# Patient Record
Sex: Female | Born: 1995 | Race: White | Hispanic: No | Marital: Single | Smoking: Never smoker
Health system: Southern US, Community
[De-identification: ages and names within clinical notes are randomized; demographics above are authoritative.]

---
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
## 2017-10-27 ENCOUNTER — Other Ambulatory Visit: Payer: Self-pay

## 2017-10-27 MED ORDER — FLUOXETINE HCL 40 MG PO CAPS: 40.0000 mg | ORAL_CAPSULE | Freq: Every day | ORAL | 1 refills | Status: DC

## 2018-01-01 ENCOUNTER — Ambulatory Visit: Payer: Self-pay | Admitting: Psychiatry

## 2018-02-09 ENCOUNTER — Encounter: Payer: Self-pay | Admitting: Emergency Medicine

## 2018-02-09 DIAGNOSIS — F329 Major depressive disorder, single episode, unspecified: Secondary | ICD-10-CM | POA: Insufficient documentation

## 2018-02-09 DIAGNOSIS — F32A Depression, unspecified: Secondary | ICD-10-CM | POA: Insufficient documentation

## 2018-02-09 DIAGNOSIS — F419 Anxiety disorder, unspecified: Secondary | ICD-10-CM

## 2018-03-28 ENCOUNTER — Ambulatory Visit: Payer: 59 | Admitting: Psychiatry

## 2018-03-28 DIAGNOSIS — F419 Anxiety disorder, unspecified: Secondary | ICD-10-CM | POA: Diagnosis not present

## 2018-03-28 MED ORDER — FLUOXETINE HCL 40 MG PO CAPS: 40.0000 mg | ORAL_CAPSULE | Freq: Every day | ORAL | 1 refills | Status: DC

## 2018-03-28 NOTE — Progress Notes (Signed)
[facility]  Patient ID: Sydney Peters,  MRN: [id number]  PCP: Patient, No Pcp Per  Date of Evaluation: 03/28/2018 Time spent:20 minutes  Chief Complaint:   HISTORY/CURRENT STATUS: HPI patient last seen 10/10/2017.  She has occasional day of depression we decided not to treat it but just to follow it.  She is to continue on her Prozac at 40 mg a day. Today she gives a history of nausea going on for 2 to 3 days at a time and the most recent one last week.  She is only had that episode several times is being worked up by her family physician.  Individual Medical History/ Review of Systems: Changes? :No   Allergies: Patient has no allergy information on record.  Current Medications:  Current Outpatient Medications:  .  FLUoxetine (PROZAC) 40 MG capsule, Take 1 capsule (40 mg total) by mouth daily., Disp: 90 capsule, Rfl: 1 Medication Side Effects: none  Family Medical/ Social History: Changes? no  MENTAL HEALTH EXAM:  There were no vitals taken for this visit.There is no height or weight on file to calculate BMI.  General Appearance: Casual  Eye Contact:  Good  Speech:  Clear and Coherent  Volume:  Normal  Mood:  Euthymic  Affect:  Appropriate  Thought Process:  Linear  Orientation:  Full (Time, Place, and Person)  Thought Content: WDL   Suicidal Thoughts:  No  Homicidal Thoughts:  No  Memory:  WNL  Judgement:  Good  Insight:  Good  Psychomotor Activity:  Normal  Concentration:  Concentration: Good  Recall:  Good  Fund of Knowledge: Good  Language: Good  Assets:  Desire for Improvement  ADL's:  Intact  Cognition: WNL  Prognosis:  Good    DIAGNOSES:    ICD-10-CM   1. Anxiety F41.9     Receiving Psychotherapy: No    RECOMMENDATIONS: We will continue patient on Prozac 40 mg a day.  She is to keep me posted if they find anything more about her episodic nausea. Return in 4 months.   Anne Fu, PA-C

## 2018-07-25 ENCOUNTER — Ambulatory Visit: Payer: 59 | Admitting: Psychiatry

## 2018-08-29 ENCOUNTER — Ambulatory Visit (INDEPENDENT_AMBULATORY_CARE_PROVIDER_SITE_OTHER): Payer: 59 | Admitting: Adult Health

## 2018-08-29 ENCOUNTER — Other Ambulatory Visit: Payer: Self-pay

## 2018-08-29 DIAGNOSIS — F411 Generalized anxiety disorder: Secondary | ICD-10-CM

## 2018-08-29 DIAGNOSIS — F331 Major depressive disorder, recurrent, moderate: Secondary | ICD-10-CM

## 2018-09-15 ENCOUNTER — Encounter: Payer: Self-pay | Admitting: Adult Health

## 2018-09-15 NOTE — Progress Notes (Signed)
Sydney Peters [id number] 08/28/1995 23 y.o.  Subjective:   Patient ID:  Sydney Peters is a 23 y.o. (DOB 02/25/1995) female.  Chief Complaint:  Chief Complaint  Patient presents with  . Anxiety    HPI Sydney Peters presents to the office today for follow-up of anxiety.  Describes mood today as "ok". Pleasant. Mood symptoms - denies depression, anxiety, and irritability. Increased anxiety at times. Stating "I'm doing alright". Stable interest and motivation. Taking medications as prescribed.  Energy levels stable. Active, does not have a regular exercise routine. Enjoys some usual interests and activities. Spending time with family. Appetite adequate. Weight stable. Sleeps well most nights. Averages 6 to 8 hours. Focus and concentration stable. Completing tasks. Managing aspects of household.  Denies SI or HI.  Denies AH or VH.  Review of Systems:  Review of Systems  Musculoskeletal: Negative for gait problem.  Neurological: Negative for tremors.  Psychiatric/Behavioral:       Please refer to HPI    Medications: I have reviewed the patient's current medications.  Current Outpatient Medications  Medication Sig Dispense Refill  . FLUoxetine (PROZAC) 40 MG capsule Take 1 capsule (40 mg total) by mouth daily. 90 capsule 1   No current facility-administered medications for this visit.     Medication Side Effects: None  Allergies: Not on File  No past medical history on file.  No family history on file.  Social History   Socioeconomic History  . Marital status: Single    Spouse name: Not on file  . Number of children: Not on file  . Years of education: Not on file  . Highest education level: Not on file  Occupational History  . Not on file  Social Needs  . Financial resource strain: Not on file  . Food insecurity    Worry: Not on file    Inability: Not on file  . Transportation needs    Medical: Not on file    Non-medical: Not on file  Tobacco Use  .  Smoking status: Not on file  Substance and Sexual Activity  . Alcohol use: Not on file  . Drug use: Not on file  . Sexual activity: Not on file  Lifestyle  . Physical activity    Days per week: Not on file    Minutes per session: Not on file  . Stress: Not on file  Relationships  . Social Musicianconnections    Talks on phone: Not on file    Gets together: Not on file    Attends religious service: Not on file    Active member of club or organization: Not on file    Attends meetings of clubs or organizations: Not on file    Relationship status: Not on file  . Intimate partner violence    Fear of current or ex partner: Not on file    Emotionally abused: Not on file    Physically abused: Not on file    Forced sexual activity: Not on file  Other Topics Concern  . Not on file  Social History Narrative  . Not on file    Past Medical History, Surgical history, Social history, and Family history were reviewed and updated as appropriate.   Please see review of systems for further details on the patient's review from today.   Objective:   Physical Exam:  There were no vitals taken for this visit.  Physical Exam Constitutional:      General: She is not in acute distress.  Appearance: She is well-developed.  Musculoskeletal:        General: No deformity.  Neurological:     Mental Status: She is alert and oriented to person, place, and time.     Coordination: Coordination normal.  Psychiatric:        Attention and Perception: Attention and perception normal. She does not perceive auditory or visual hallucinations.        Mood and Affect: Mood normal. Mood is not anxious or depressed. Affect is not labile, blunt, angry or inappropriate.        Speech: Speech normal.        Behavior: Behavior normal.        Thought Content: Thought content normal. Thought content is not paranoid or delusional. Thought content does not include homicidal or suicidal ideation. Thought content does not  include homicidal or suicidal plan.        Cognition and Memory: Cognition and memory normal.        Judgment: Judgment normal.     Comments: Insight intact     Lab Review:  No results found for: NA, K, CL, CO2, GLUCOSE, BUN, CREATININE, CALCIUM, PROT, ALBUMIN, AST, ALT, ALKPHOS, BILITOT, GFRNONAA, GFRAA  No results found for: WBC, RBC, HGB, HCT, PLT, MCV, MCH, MCHC, RDW, LYMPHSABS, MONOABS, EOSABS, BASOSABS  No results found for: POCLITH, LITHIUM   No results found for: PHENYTOIN, PHENOBARB, VALPROATE, CBMZ   .res Assessment: Plan:    Plan:  1. Continue Prozac 40mg  daily  RTC 4 weeks  Patient advised to contact office with any questions, adverse effects, or acute worsening in signs and symptoms.  There are no diagnoses linked to this encounter.   Please see After Visit Summary for patient specific instructions.  Future Appointments  Date Time Provider [place]  02/28/2019  4:00 PM Anish Vana, Berdie Ogren, NP [facility] None    No orders of the defined types were placed in this encounter.   -------------------------------

## 2019-02-28 ENCOUNTER — Ambulatory Visit: Payer: 59 | Admitting: Adult Health

## 2021-03-12 ENCOUNTER — Telehealth: Payer: Self-pay | Admitting: Adult Health

## 2021-03-12 NOTE — Telephone Encounter (Signed)
Pt called and wanted a refill of Prozac until they get to their appt on 3/1.  Pharmacy is [facility] on [address].

## 2021-03-12 NOTE — Telephone Encounter (Signed)
Since it has been so long since patient has been seen I let her know that we could not prescribe anything until she sees [place].

## 2021-03-24 ENCOUNTER — Other Ambulatory Visit: Payer: Self-pay

## 2021-03-24 ENCOUNTER — Encounter: Payer: Self-pay | Admitting: Adult Health

## 2021-03-24 ENCOUNTER — Ambulatory Visit (INDEPENDENT_AMBULATORY_CARE_PROVIDER_SITE_OTHER): Payer: 59 | Admitting: Adult Health

## 2021-03-24 DIAGNOSIS — F411 Generalized anxiety disorder: Secondary | ICD-10-CM | POA: Diagnosis not present

## 2021-03-24 NOTE — Progress Notes (Signed)
Leonor Liv ?[id number] ?Dec 18, 1995 ?26 y.o. ? ?Subjective:  ? ?Patient ID:  Tonnette Zwiebel is a 26 y.o. (DOB 04-26-1995) female. ? ?Chief Complaint: No chief complaint on file. ? ? ?HPI ?Tasheena Wambolt presents to the office today for follow-up of anxiety. ? ?Describes mood today as "ok". Pleasant. Mood symptoms - denies depression. Feels irritable at times. Feels more anxious overall. Stating "I feel like I'm doing ok now". Her mother made her the appointment today after she made a statement about "killing herself". Stating "I have felt so overwhelmed trying to please everyone". Started dating in July - met through a mutual friend - her first serious boyfriend. Felt like things moved to quickly and he wanted the relationship to advance more quickly than she was ready for. Felt like he questioned her about things he shouldn't - couldn't look at people - questioning her past. Friends and family did not feel like she was in a healthy relationship. She felt torn throughout the relationship and has ended it. He is still trying to communicate with her. Reports some passive suicidal ideations over the past month - caught between her boyfriend and friends - told her mother she had told her boyfriend she wanted to kill herself after feeling so pressured. Has felt more stable over the past week. Stating "I'm not having those thoughts anymore". Stopped taking Prozac 2 years ago and does not want to restart medications. ?Stating "I don't feel like I need medication". Would like to see a therapist to  Feels like she would like to talk process through her relationship. Stable interest and motivation.  ?Energy levels stable. Active, has a regular exercise routine - going to the gym daily. ?Enjoys some usual interests and activities. Single lives alone. Parents local. Spending time with family. ?Appetite adequate. Weight loss - intentional - eating healthy. ?Sleeps well most nights. Averages 6 to 8 hours. Using Melatonin for the  past month - 5 to 38m. ?Focus and concentration stable. Completing tasks. Managing aspects of household. 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   ?Denies AH or VH. ? ?Review of Systems:  ?Review of Systems  ?Musculoskeletal:  Negative for gait problem.  ?Neurological:  Negative for tremors.  ?Psychiatric/Behavioral:    ?     Please refer to HPI  ? ?Medications: I have reviewed the patient's current medications. ? ?No current outpatient medications on file.  ? ?No current facility-administered medications for this visit.  ? ? ?Medication Side Effects: None ? ?Allergies: No Known Allergies ? ?No past medical history on file. ? ?Past Medical History, Surgical history, Social history, and Family history were reviewed and updated as appropriate.  ? ?Please see review of systems for further details on the patient's review from today.  ? ?Objective:  ? ?Physical Exam:  ?There were no vitals taken for this visit. ? ?Physical Exam ?Constitutional:   ?   General: She is not in acute distress. ?Musculoskeletal:     ?   General: No deformity.  ?Neurological:  ?   Mental Status: She is alert and oriented to person, place, and time.  ?   Coordination: Coordination normal.  ?Psychiatric:     ?   Attention and Perception: Attention and perception normal. She does not perceive auditory or visual hallucinations.     ?   Mood and Affect: Mood normal. Mood is not anxious or depressed. Affect is not labile, blunt, angry or inappropriate.     ?  Speech: Speech normal.     ?   Behavior: Behavior normal.     ?   Thought Content: Thought content normal. Thought content is not paranoid or delusional. Thought content does not include homicidal or suicidal ideation. Thought content does not include homicidal or suicidal plan.     ?   Cognition and Memory: Cognition and memory normal.     ?   Judgment: Judgment normal.  ?   Comments: Insight intact  ? ? ?Lab Review:  ?No results found for: NA, K, CL, CO2, GLUCOSE, BUN,  CREATININE, CALCIUM, PROT, ALBUMIN, AST, ALT, ALKPHOS, BILITOT, GFRNONAA, GFRAA ? ?No results found for: WBC, RBC, HGB, HCT, PLT, MCV, MCH, MCHC, RDW, LYMPHSABS, MONOABS, EOSABS, BASOSABS ? ?No results found for: POCLITH, LITHIUM  ? ?No results found for: PHENYTOIN, PHENOBARB, VALPROATE, CBMZ  ? ?.res ?Assessment: Plan:   ? ?Plan: ? ?Will not start medications at this time. ? ?Referred to counseling - cards given for Eliot Ford or Tamora Crutchfield's office. ? ?Advised to add mother to [facility] if wanted her to receive information. ? ?RTC as needed. ? ?Patient advised to contact office with any questions, adverse effects, or acute worsening in signs and symptoms. ? ? ?Time spent with patient was 25 minutes. Greater than 50% of face to face time with patient was spent on counseling and coordination of care.   ? ?Diagnoses and all orders for this visit: ? ?Generalized anxiety disorder ? ?  ? ?Please see After Visit Summary for patient specific instructions. ? ?No future appointments. ? ? ?No orders of the defined types were placed in this encounter. ? ? ?------------------------------- ?

## 2021-06-15 ENCOUNTER — Telehealth: Payer: Self-pay | Admitting: Adult Health

## 2021-06-15 NOTE — Telephone Encounter (Signed)
Pt called at 3:52 pm asking to go back on  the prozac. Pharmacy is [facility] [address]

## 2021-06-15 NOTE — Telephone Encounter (Signed)
Please call patient to schedule an appt, per Gina's note.

## 2021-06-15 NOTE — Telephone Encounter (Signed)
Needs an appt to discuss.

## 2021-06-16 NOTE — Telephone Encounter (Signed)
LVM for pt to call and schedule  °

## 2021-08-16 ENCOUNTER — Ambulatory Visit (INDEPENDENT_AMBULATORY_CARE_PROVIDER_SITE_OTHER): Payer: BC Managed Care – PPO | Admitting: Adult Health

## 2021-08-16 ENCOUNTER — Encounter: Payer: Self-pay | Admitting: Adult Health

## 2021-08-16 DIAGNOSIS — F320 Major depressive disorder, single episode, mild: Secondary | ICD-10-CM | POA: Diagnosis not present

## 2021-08-16 DIAGNOSIS — F422 Mixed obsessional thoughts and acts: Secondary | ICD-10-CM

## 2021-08-16 DIAGNOSIS — F411 Generalized anxiety disorder: Secondary | ICD-10-CM

## 2021-08-16 MED ORDER — FLUOXETINE HCL 20 MG PO CAPS: 20.0000 mg | ORAL_CAPSULE | Freq: Every day | ORAL | 2 refills | Status: DC

## 2021-08-16 NOTE — Progress Notes (Signed)
Sharlynn Seckinger [id number] [birth year] 26 y.o.  Subjective:   Patient ID:  Sydney Peters is a 26 y.o. (DOB October 08, 1995) female.  Chief Complaint: No chief complaint on file.   HPI Sydney Peters presents to the office today for follow-up of MDD and GAD.   Describes mood today as "ok". Tearful at times. Pleasant. Mood symptoms - reports depression, anxiety, and irritability. Reports worry and rumination. Reports obsessional thoughts and acts. Reports mood fluctuations. Has tried to manage mood symptoms with therapy and on her on, but feels like she may medication. She has taken Prozac in the past and is willing to try it again. Stating "I'm not doing too good managing things on my own". Has seen an online therapist over the past 6 months - [employer]. Stable interest and motivation.  Energy levels stable. Active, has a regular exercise routine - going to the gym daily. Enjoys some usual interests and activities. Single lives alone. Parents local. Spending time with family. Appetite adequate. Weight stable. Sleeps well most nights. Averages 6 to 8 hours. Has used Melatonin up until a few days ago. Focus and concentration stable. Completing tasks. Managing aspects of household. 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 Denies SI or HI.   Denies AH or VH.     Review of Systems:  Review of Systems  Musculoskeletal:  Negative for gait problem.  Neurological:  Negative for tremors.  Psychiatric/Behavioral:         Please refer to HPI    Medications: I have reviewed the patient's current medications.  No current outpatient medications on file.   No current facility-administered medications for this visit.    Medication Side Effects: None  Allergies: No Known Allergies  No past medical history on file.  Past Medical History, Surgical history, Social history, and Family history were reviewed and updated as appropriate.   Please see review of systems for  further details on the patient's review from today.   Objective:   Physical Exam:  There were no vitals taken for this visit.  Physical Exam Constitutional:      General: She is not in acute distress. Musculoskeletal:        General: No deformity.  Neurological:     Mental Status: She is alert and oriented to person, place, and time.     Coordination: Coordination normal.  Psychiatric:        Attention and Perception: Attention and perception normal. She does not perceive auditory or visual hallucinations.        Mood and Affect: Mood normal. Mood is not anxious or depressed. Affect is not labile, blunt, angry or inappropriate.        Speech: Speech normal.        Behavior: Behavior normal.        Thought Content: Thought content normal. Thought content is not paranoid or delusional. Thought content does not include homicidal or suicidal ideation. Thought content does not include homicidal or suicidal plan.        Cognition and Memory: Cognition and memory normal.        Judgment: Judgment normal.     Comments: Insight intact     Lab Review:  No results found for: "NA", "K", "CL", "CO2", "GLUCOSE", "BUN", "CREATININE", "CALCIUM", "PROT", "ALBUMIN", "AST", "ALT", "ALKPHOS", "BILITOT", "GFRNONAA", "GFRAA"  No results found for: "WBC", "RBC", "HGB", "HCT", "PLT", "MCV", "MCH", "MCHC", "RDW", "LYMPHSABS", "MONOABS", "EOSABS", "BASOSABS"  No results found for: "POCLITH", "LITHIUM"  No results found for: "PHENYTOIN", "PHENOBARB", "VALPROATE", "CBMZ"   .res Assessment: Plan:    Plan:  Add Prozac 20mg  daily - has taken previously  Working with a therapist.   Advised to add mother to [facility]   RTC 4 weeks  Patient advised to contact office with any questions, adverse effects, or acute worsening in signs and symptoms.  Time spent with patient was 25 minutes. Greater than 50% of face to face time with patient was spent on counseling and coordination of care.    There are no  diagnoses linked to this encounter.   Please see After Visit Summary for patient specific instructions.  No future appointments.  No orders of the defined types were placed in this encounter.   -------------------------------

## 2021-09-13 ENCOUNTER — Encounter: Payer: Self-pay | Admitting: Adult Health

## 2021-09-13 ENCOUNTER — Ambulatory Visit (INDEPENDENT_AMBULATORY_CARE_PROVIDER_SITE_OTHER): Payer: BC Managed Care – PPO | Admitting: Adult Health

## 2021-09-13 DIAGNOSIS — F320 Major depressive disorder, single episode, mild: Secondary | ICD-10-CM

## 2021-09-13 DIAGNOSIS — F422 Mixed obsessional thoughts and acts: Secondary | ICD-10-CM | POA: Diagnosis not present

## 2021-09-13 DIAGNOSIS — F411 Generalized anxiety disorder: Secondary | ICD-10-CM | POA: Diagnosis not present

## 2021-09-13 NOTE — Progress Notes (Signed)
Sydney Peters [id number] 11-15-1995 26 y.o.  Subjective:   Patient ID:  Sydney Peters is a 26 y.o. (DOB 1995/05/03) female.  Chief Complaint: No chief complaint on file.   HPI Sydney Peters presents to the office today for follow-up of MDD and GAD.   Describes mood today as "ok". Tearful at times. Pleasant. Mood symptoms - reports decreased depression, anxiety and irritability. Reports decreased worry and rumination. Reports decreased obsessional thoughts and acts. Reports decreased mood fluctuations. Stating "I feel better". Feels like the Prozac has been helpful. Planning to continue with therapist. Stable interest and motivation.  Energy levels stable. Active, has a regular exercise routine - going to the gym daily. Enjoys some usual interests and activities. Single lives alone. Parents local. Spending time with family. Appetite adequate. Weight stable. Sleeps well most nights. Averages 6 to 8 hours - using Melatonin as needed. Focus and concentration stable. Completing tasks. Managing aspects of household. 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 Denies SI or HI.   Denies AH or VH.  Review of Systems:  Review of Systems  Musculoskeletal:  Negative for gait problem.  Neurological:  Negative for tremors.  Psychiatric/Behavioral:         Please refer to HPI    Medications: I have reviewed the patient's current medications.  Current Outpatient Medications  Medication Sig Dispense Refill   FLUoxetine (PROZAC) 20 MG capsule Take 1 capsule (20 mg total) by mouth daily. 30 capsule 2   No current facility-administered medications for this visit.    Medication Side Effects: None  Allergies: No Known Allergies  No past medical history on file.  Past Medical History, Surgical history, Social history, and Family history were reviewed and updated as appropriate.   Please see review of systems for further details on the patient's review from today.    Objective:   Physical Exam:  There were no vitals taken for this visit.  Physical Exam Constitutional:      General: She is not in acute distress. Musculoskeletal:        General: No deformity.  Neurological:     Mental Status: She is alert and oriented to person, place, and time.     Coordination: Coordination normal.  Psychiatric:        Attention and Perception: Attention and perception normal. She does not perceive auditory or visual hallucinations.        Mood and Affect: Mood normal. Mood is not anxious or depressed. Affect is not labile, blunt, angry or inappropriate.        Speech: Speech normal.        Behavior: Behavior normal.        Thought Content: Thought content normal. Thought content is not paranoid or delusional. Thought content does not include homicidal or suicidal ideation. Thought content does not include homicidal or suicidal plan.        Cognition and Memory: Cognition and memory normal.        Judgment: Judgment normal.     Comments: Insight intact     Lab Review:  No results found for: "NA", "K", "CL", "CO2", "GLUCOSE", "BUN", "CREATININE", "CALCIUM", "PROT", "ALBUMIN", "AST", "ALT", "ALKPHOS", "BILITOT", "GFRNONAA", "GFRAA"  No results found for: "WBC", "RBC", "HGB", "HCT", "PLT", "MCV", "MCH", "MCHC", "RDW", "LYMPHSABS", "MONOABS", "EOSABS", "BASOSABS"  No results found for: "POCLITH", "LITHIUM"   No results found for: "PHENYTOIN", "PHENOBARB", "VALPROATE", "CBMZ"   .res Assessment: Plan:    Plan:  Prozac 20mg  daily  RTC 2 months  Patient advised to contact office with any questions, adverse effects, or acute worsening in signs and symptoms.  Time spent with patient was 15 minutes. Greater than 50% of face to face time with patient was spent on counseling and coordination of care.    Diagnoses and all orders for this visit:  Generalized anxiety disorder  Current mild episode of major depressive disorder, unspecified whether recurrent  (HCC)  Mixed obsessional thoughts and acts     Please see After Visit Summary for patient specific instructions.  No future appointments.   No orders of the defined types were placed in this encounter.   -------------------------------

## 2021-11-16 ENCOUNTER — Encounter: Payer: Self-pay | Admitting: Adult Health

## 2021-11-16 ENCOUNTER — Telehealth (INDEPENDENT_AMBULATORY_CARE_PROVIDER_SITE_OTHER): Payer: BC Managed Care – PPO | Admitting: Adult Health

## 2021-11-16 DIAGNOSIS — F411 Generalized anxiety disorder: Secondary | ICD-10-CM

## 2021-11-16 DIAGNOSIS — F320 Major depressive disorder, single episode, mild: Secondary | ICD-10-CM | POA: Diagnosis not present

## 2021-11-16 MED ORDER — FLUOXETINE HCL 20 MG PO CAPS: 20.0000 mg | ORAL_CAPSULE | Freq: Every day | ORAL | 1 refills | Status: DC

## 2021-11-16 NOTE — Progress Notes (Signed)
Sydney Peters [id number] 28-Mar-1995 26 y.o.  Virtual Visit via Video Note  I connected with pt @ on 11/16/21 at  5:00 PM EDT by a video enabled telemedicine application and verified that I am speaking with the correct person using two identifiers.   I discussed the limitations of evaluation and management by telemedicine and the availability of in person appointments. The patient expressed understanding and agreed to proceed.  I discussed the assessment and treatment plan with the patient. The patient was provided an opportunity to ask questions and all were answered. The patient agreed with the plan and demonstrated an understanding of the instructions.   The patient was advised to call back or seek an in-person evaluation if the symptoms worsen or if the condition fails to improve as anticipated.  I provided 10 minutes of non-face-to-face time during this encounter.  The patient was located at home.  The provider was located at [facility].   Dorothyann Gibbs, 26   Subjective:   Patient ID:  Sydney Peters is a 26 y.o. (DOB [birth year]) female.  Chief Complaint: No chief complaint on file.   HPI Sydney Peters presents for follow-up of MDD and GAD.  Describes mood today as "ok". Tearful at times. Pleasant. Mood symptoms - reports decreased depression, anxiety and irritability. Reports decreased worry and rumination. Reports decreased obsessional thoughts and acts. Reports decreased mood fluctuations. Stating "I feel better". Feels like the Prozac has been helpful. Planning to continue with therapist. Stable interest and motivation.  Energy levels stable. Active, has a regular exercise routine - going to the gym daily. Enjoys some usual interests and activities. Single lives alone. Parents local. Spending time with family. Appetite adequate. Weight stable. Sleeps well most nights. Averages 6 to 8 hours - using Melatonin as needed. Focus and concentration stable.  Completing tasks. Managing aspects of household. 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 Denies SI or HI.   Denies AH or VH.   Review of Systems:  Review of Systems  Musculoskeletal:  Negative for gait problem.  Neurological:  Negative for tremors.  Psychiatric/Behavioral:         Please refer to HPI    Medications: I have reviewed the patient's current medications.  Current Outpatient Medications  Medication Sig Dispense Refill   FLUoxetine (PROZAC) 20 MG capsule Take 1 capsule (20 mg total) by mouth daily. 90 capsule 1   No current facility-administered medications for this visit.    Medication Side Effects: None  Allergies: No Known Allergies  No past medical history on file.  No family history on file.  Social History   Socioeconomic History   Marital status: Single    Spouse name: Not on file   Number of children: Not on file   Years of education: Not on file   Highest education level: Not on file  Occupational History   Not on file  Tobacco Use   Smoking status: Never   Smokeless tobacco: Never  Substance and Sexual Activity   Alcohol use: Not on file   Drug use: Not on file   Sexual activity: Not on file  Other Topics Concern   Not on file  Social History Narrative   Not on file   Social Determinants of Health   Financial Resource Strain: Not on file  Food Insecurity: Not on file  Transportation Needs: Not on file  Physical Activity: Not on file  Stress: Not on file  Social Connections: Not  on file  Intimate Partner Violence: Not on file    Past Medical History, Surgical history, Social history, and Family history were reviewed and updated as appropriate.   Please see review of systems for further details on the patient's review from today.   Objective:   Physical Exam:  There were no vitals taken for this visit.  Physical Exam Constitutional:      General: She is not in acute distress. Musculoskeletal:         General: No deformity.  Neurological:     Mental Status: She is alert and oriented to person, place, and time.     Coordination: Coordination normal.  Psychiatric:        Attention and Perception: Attention and perception normal. She does not perceive auditory or visual hallucinations.        Mood and Affect: Mood normal. Mood is not anxious or depressed. Affect is not labile, blunt, angry or inappropriate.        Speech: Speech normal.        Behavior: Behavior normal.        Thought Content: Thought content normal. Thought content is not paranoid or delusional. Thought content does not include homicidal or suicidal ideation. Thought content does not include homicidal or suicidal plan.        Cognition and Memory: Cognition and memory normal.        Judgment: Judgment normal.     Comments: Insight intact     Lab Review:  No results found for: "NA", "K", "CL", "CO2", "GLUCOSE", "BUN", "CREATININE", "CALCIUM", "PROT", "ALBUMIN", "AST", "ALT", "ALKPHOS", "BILITOT", "GFRNONAA", "GFRAA"  No results found for: "WBC", "RBC", "HGB", "HCT", "PLT", "MCV", "MCH", "MCHC", "RDW", "LYMPHSABS", "MONOABS", "EOSABS", "BASOSABS"  No results found for: "POCLITH", "LITHIUM"   No results found for: "PHENYTOIN", "PHENOBARB", "VALPROATE", "CBMZ"   .res Assessment: Plan:    Plan:  Prozac 20mg  daily  RTC 3 months  Patient advised to contact office with any questions, adverse effects, or acute worsening in signs and symptoms.  Time spent with patient was 15 minutes. Greater than 50% of face to face time with patient was spent on counseling and coordination of care.    Diagnoses and all orders for this visit:  Generalized anxiety disorder -     FLUoxetine (PROZAC) 20 MG capsule; Take 1 capsule (20 mg total) by mouth daily.  Current mild episode of major depressive disorder, unspecified whether recurrent (HCC) -     FLUoxetine (PROZAC) 20 MG capsule; Take 1 capsule (20 mg total) by mouth daily.      Please see After Visit Summary for patient specific instructions.  No future appointments.  No orders of the defined types were placed in this encounter.     -------------------------------

## 2021-11-16 NOTE — Progress Notes (Signed)
Sydney Peters [id number] 11-Mar-1995 26 y.o.  Virtual Visit via Video Note  I connected with pt @ on 11/16/21 at  5:00 PM EDT by a video enabled telemedicine application and verified that I am speaking with the correct person using two identifiers.   I discussed the limitations of evaluation and management by telemedicine and the availability of in person appointments. The patient expressed understanding and agreed to proceed.  I discussed the assessment and treatment plan with the patient. The patient was provided an opportunity to ask questions and all were answered. The patient agreed with the plan and demonstrated an understanding of the instructions.   The patient was advised to call back or seek an in-person evaluation if the symptoms worsen or if the condition fails to improve as anticipated.  I provided 10 minutes of non-face-to-face time during this encounter.  The patient was located at home.  The provider was located at [facility].   Aloha Gell, NP   Subjective:   Patient ID:  Sydney Peters is a 26 y.o. (DOB [birth year]) female.  Chief Complaint: No chief complaint on file.   HPI Sydney Peters presents for follow-up of MDD and GAD.   Describes mood today as "ok". Tearful at times. Pleasant. Mood symptoms - reports decreased depression, anxiety and irritability - "feeling more stable". Reports decreased worry and rumination - "less frequently". Reports decreased obsessional thoughts and acts. Mood is more consistent. Stating "I feel like I'm doing ok".Feels like the Prozac works well. Planning to continue with therapist. Stable interest and motivation.  Energy levels stable. Active, has a regular exercise routine - going to the gym daily. Enjoys some usual interests and activities. Single lives alone. Parents local. Spending time with family. Appetite adequate. Weight stable. Sleeps well most nights. Averages 6 to 8 hours. Focus and concentration stable.  Completing tasks. Managing aspects of household. 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 Denies SI or HI.   Denies AH or VH.   Review of Systems:  Review of Systems  Musculoskeletal:  Negative for gait problem.  Neurological:  Negative for tremors.  Psychiatric/Behavioral:         Please refer to HPI    Medications: I have reviewed the patient's current medications.  Current Outpatient Medications  Medication Sig Dispense Refill   FLUoxetine (PROZAC) 20 MG capsule Take 1 capsule (20 mg total) by mouth daily. 30 capsule 2   No current facility-administered medications for this visit.    Medication Side Effects: None  Allergies: No Known Allergies  No past medical history on file.  No family history on file.  Social History   Socioeconomic History   Marital status: Single    Spouse name: Not on file   Number of children: Not on file   Years of education: Not on file   Highest education level: Not on file  Occupational History   Not on file  Tobacco Use   Smoking status: Never   Smokeless tobacco: Never  Substance and Sexual Activity   Alcohol use: Not on file   Drug use: Not on file   Sexual activity: Not on file  Other Topics Concern   Not on file  Social History Narrative   Not on file   Social Determinants of Health   Financial Resource Strain: Not on file  Food Insecurity: Not on file  Transportation Needs: Not on file  Physical Activity: Not on file  Stress: Not on file  Social Connections: Not on file  Intimate Partner Violence: Not on file    Past Medical History, Surgical history, Social history, and Family history were reviewed and updated as appropriate.   Please see review of systems for further details on the patient's review from today.   Objective:   Physical Exam:  There were no vitals taken for this visit.  Physical Exam Constitutional:      General: She is not in acute distress. Musculoskeletal:         General: No deformity.  Neurological:     Mental Status: She is alert and oriented to person, place, and time.     Coordination: Coordination normal.  Psychiatric:        Attention and Perception: Attention and perception normal. She does not perceive auditory or visual hallucinations.        Mood and Affect: Mood normal. Mood is not anxious or depressed. Affect is not labile, blunt, angry or inappropriate.        Speech: Speech normal.        Behavior: Behavior normal.        Thought Content: Thought content normal. Thought content is not paranoid or delusional. Thought content does not include homicidal or suicidal ideation. Thought content does not include homicidal or suicidal plan.        Cognition and Memory: Cognition and memory normal.        Judgment: Judgment normal.     Comments: Insight intact     Lab Review:  No results found for: "NA", "K", "CL", "CO2", "GLUCOSE", "BUN", "CREATININE", "CALCIUM", "PROT", "ALBUMIN", "AST", "ALT", "ALKPHOS", "BILITOT", "GFRNONAA", "GFRAA"  No results found for: "WBC", "RBC", "HGB", "HCT", "PLT", "MCV", "MCH", "MCHC", "RDW", "LYMPHSABS", "MONOABS", "EOSABS", "BASOSABS"  No results found for: "POCLITH", "LITHIUM"   No results found for: "PHENYTOIN", "PHENOBARB", "VALPROATE", "CBMZ"   .res Assessment: Plan:    Plan:  Prozac 20mg  daily  RTC 2 months  Patient advised to contact office with any questions, adverse effects, or acute worsening in signs and symptoms.  Time spent with patient was 15 minutes. Greater than 50% of face to face time with patient was spent on counseling and coordination of care.    Diagnoses and all orders for this visit:  Generalized anxiety disorder  Current mild episode of major depressive disorder, unspecified whether recurrent (HCC)     Please see After Visit Summary for patient specific instructions.  Future Appointments  Date Time Provider Department Center  11/16/2021  5:00 PM Jaleil Renwick, 11/18/2021, NP [facility] None    No orders of the defined types were placed in this encounter.     -------------------------------

## 2021-11-29 ENCOUNTER — Ambulatory Visit (INDEPENDENT_AMBULATORY_CARE_PROVIDER_SITE_OTHER): Payer: BC Managed Care – PPO | Admitting: Behavioral Health

## 2021-11-29 DIAGNOSIS — F422 Mixed obsessional thoughts and acts: Secondary | ICD-10-CM

## 2021-11-29 DIAGNOSIS — F411 Generalized anxiety disorder: Secondary | ICD-10-CM

## 2021-11-29 DIAGNOSIS — F320 Major depressive disorder, single episode, mild: Secondary | ICD-10-CM | POA: Diagnosis not present

## 2021-11-29 DIAGNOSIS — F1210 Cannabis abuse, uncomplicated: Secondary | ICD-10-CM | POA: Diagnosis not present
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
## 2021-11-30 ENCOUNTER — Ambulatory Visit (INDEPENDENT_AMBULATORY_CARE_PROVIDER_SITE_OTHER): Payer: BC Managed Care – PPO | Admitting: Adult Health

## 2021-11-30 DIAGNOSIS — F411 Generalized anxiety disorder: Secondary | ICD-10-CM | POA: Diagnosis not present

## 2021-11-30 DIAGNOSIS — F422 Mixed obsessional thoughts and acts: Secondary | ICD-10-CM

## 2021-11-30 DIAGNOSIS — F39 Unspecified mood [affective] disorder: Secondary | ICD-10-CM

## 2021-11-30 DIAGNOSIS — F320 Major depressive disorder, single episode, mild: Secondary | ICD-10-CM

## 2021-11-30 MED ORDER — LAMOTRIGINE 25 MG PO TABS: ORAL_TABLET | ORAL | 2 refills | Status: DC

## 2021-11-30 NOTE — Progress Notes (Incomplete)
Sydney Peters [id number] 1995-01-27 26 y.o.  Subjective:   Patient ID:  Sydney Peters is a 26 y.o. (DOB 09-Sep-1995) female.  Chief Complaint: No chief complaint on file.   HPI Sydney Peters presents to the office today for follow-up of MDD and GAD.  Describes mood today as "not the best". Tearful at times. Pleasant. Mood symptoms - reports increased depression, anxiety and irritability.    Does not feel like the Prozac is helpful enough for her not to do bad things for her.   Reports increased worry and rumination.   Reports increased obsessional thoughts and acts. Reports increased mood fluctuations. Stating "I feel better". Feels like the Prozac has been helpful. Planning to continue with therapist. Stable interest and motivation.   Reports binge eating and throwing up.  Making bad decisions.  Feels sad inside.  Mornings are hard feels very emotional. Compartmentalizes pain during the day.       Energy levels stable. Active, has a regular exercise routine - going to the gym daily. Enjoys some usual interests and activities. Single lives alone. Parents local. Spending time with family. Appetite adequate. Weight stable. Sleeps well most nights. Averages 8 hours. Focus and concentration stable. Completing tasks. Managing aspects of household. 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 Denies SI or HI.   Denies AH or VH. Reports smoking THC daily.  Review of Systems:  Review of Systems  Medications: {medication reviewed/display:3041432}  Current Outpatient Medications  Medication Sig Dispense Refill  . FLUoxetine (PROZAC) 20 MG capsule Take 1 capsule (20 mg total) by mouth daily. 90 capsule 1   No current facility-administered medications for this visit.    Medication Side Effects: {Medication Side Effects (Optional):21014029}  Allergies: No Known Allergies  No past medical history on file.  Past Medical History, Surgical history,  Social history, and Family history were reviewed and updated as appropriate.   Please see review of systems for further details on the patient's review from today.   Objective:   Physical Exam:  There were no vitals taken for this visit.  Physical Exam  Lab Review:  No results found for: "NA", "K", "CL", "CO2", "GLUCOSE", "BUN", "CREATININE", "CALCIUM", "PROT", "ALBUMIN", "AST", "ALT", "ALKPHOS", "BILITOT", "GFRNONAA", "GFRAA"  No results found for: "WBC", "RBC", "HGB", "HCT", "PLT", "MCV", "MCH", "MCHC", "RDW", "LYMPHSABS", "MONOABS", "EOSABS", "BASOSABS"  No results found for: "POCLITH", "LITHIUM"   No results found for: "PHENYTOIN", "PHENOBARB", "VALPROATE", "CBMZ"   .res Assessment: Plan:    Plan:  Prozac 20mg  daily Lamictal   RTC 3 months  Patient advised to contact office with any questions, adverse effects, or acute worsening in signs and symptoms.  Time spent with patient was 15 minutes. Greater than 50% of face to face time with patient was spent on counseling and coordination of care.   There are no diagnoses linked to this encounter.   Please see After Visit Summary for patient specific instructions.  Future Appointments  Date Time Provider [place]  05/23/2022  4:20 PM Meighan Treto, Berdie Ogren, NP [facility] None    No orders of the defined types were placed in this encounter.   -------------------------------

## 2021-12-01 ENCOUNTER — Encounter: Payer: Self-pay | Admitting: Adult Health

## 2021-12-06 ENCOUNTER — Encounter: Payer: Self-pay | Admitting: Behavioral Health

## 2021-12-28 ENCOUNTER — Encounter: Payer: Self-pay | Admitting: Adult Health

## 2021-12-28 ENCOUNTER — Telehealth (INDEPENDENT_AMBULATORY_CARE_PROVIDER_SITE_OTHER): Payer: BC Managed Care – PPO | Admitting: Adult Health

## 2021-12-28 DIAGNOSIS — F39 Unspecified mood [affective] disorder: Secondary | ICD-10-CM | POA: Diagnosis not present

## 2021-12-28 DIAGNOSIS — F411 Generalized anxiety disorder: Secondary | ICD-10-CM

## 2021-12-28 MED ORDER — LAMOTRIGINE 100 MG PO TABS: ORAL_TABLET | ORAL | 2 refills | Status: DC

## 2021-12-28 NOTE — Progress Notes (Signed)
Sydney Peters [id number] 01/18/1996 26 y.o.  Virtual Visit via Video Note  I connected with pt @ on 12/28/21 at  5:20 PM EST by a video enabled telemedicine application and verified that I am speaking with the correct person using two identifiers.   I discussed the limitations of evaluation and management by telemedicine and the availability of in person appointments. The patient expressed understanding and agreed to proceed.  I discussed the assessment and treatment plan with the patient. The patient was provided an opportunity to ask questions and all were answered. The patient agreed with the plan and demonstrated an understanding of the instructions.   The patient was advised to call back or seek an in-person evaluation if the symptoms worsen or if the condition fails to improve as anticipated.  I provided 15 minutes of non-face-to-face time during this encounter.  The patient was located at home.  The provider was located at [facility].   Dorothyann Gibbs, NP   Subjective:   Patient ID:  Sydney Peters is a 26 y.o. (DOB [birth year]) female.  Chief Complaint: No chief complaint on file.   HPI Kiaira Pointer presents for follow-up of MDD and GAD.  Describes mood today as "a little better". Tearful at times. Pleasant. Mood symptoms - reports increased depression, anxiety and irritability - "feeling the severity - less frequently". Feels like the addition of Lamictal has been helpful. Reports decreased worry and rumination. Reports decreased obsessional thoughts and acts. Reports making less "bad" decisions - has not called ex recently. Reports decreased mood fluctuations. Stating "I feel a little better". Tolerating the addition of Lamictal. Seeing a new counselor. Improved interest and motivation - "getting better".  Energy levels stable. Active, has a regular exercise routine - going to the gym daily. Enjoys some usual interests and activities. Single lives alone.  Parents local. Spending time with family. Appetite adequate. Weight stable. Sleeps well most nights. Averages 8 hours. Focus and concentration stable. Completing tasks. Managing aspects of household. 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 Denies SI or HI.   Denies AH or VH. Reports smoking THC daily - "getting closer to not needing anymore".   Review of Systems:  Review of Systems  Musculoskeletal:  Negative for gait problem.  Neurological:  Negative for tremors.  Psychiatric/Behavioral:         Please refer to HPI    Medications: I have reviewed the patient's current medications.  Current Outpatient Medications  Medication Sig Dispense Refill   lamoTRIgine (LAMICTAL) 100 MG tablet Take one tablet at bedtime. 30 tablet 2   No current facility-administered medications for this visit.    Medication Side Effects: None  Allergies: No Known Allergies  No past medical history on file.  No family history on file.  Social History   Socioeconomic History   Marital status: Single    Spouse name: Not on file   Number of children: Not on file   Years of education: Not on file   Highest education level: Not on file  Occupational History   Not on file  Tobacco Use   Smoking status: Never   Smokeless tobacco: Never  Substance and Sexual Activity   Alcohol use: Not on file   Drug use: Not on file   Sexual activity: Not on file  Other Topics Concern   Not on file  Social History Narrative   Not on file   Social Determinants of Health   Financial Resource  Strain: Not on file  Food Insecurity: Not on file  Transportation Needs: Not on file  Physical Activity: Not on file  Stress: Not on file  Social Connections: Not on file  Intimate Partner Violence: Not on file    Past Medical History, Surgical history, Social history, and Family history were reviewed and updated as appropriate.   Please see review of systems for further details on the  patient's review from today.   Objective:   Physical Exam:  There were no vitals taken for this visit.  Physical Exam Constitutional:      General: She is not in acute distress. Musculoskeletal:        General: No deformity.  Neurological:     Mental Status: She is alert and oriented to person, place, and time.     Coordination: Coordination normal.  Psychiatric:        Attention and Perception: Attention and perception normal. She does not perceive auditory or visual hallucinations.        Mood and Affect: Mood normal. Mood is not anxious or depressed. Affect is not labile, blunt, angry or inappropriate.        Speech: Speech normal.        Behavior: Behavior normal.        Thought Content: Thought content normal. Thought content is not paranoid or delusional. Thought content does not include homicidal or suicidal ideation. Thought content does not include homicidal or suicidal plan.        Cognition and Memory: Cognition and memory normal.        Judgment: Judgment normal.     Comments: Insight intact     Lab Review:  No results found for: "NA", "K", "CL", "CO2", "GLUCOSE", "BUN", "CREATININE", "CALCIUM", "PROT", "ALBUMIN", "AST", "ALT", "ALKPHOS", "BILITOT", "GFRNONAA", "GFRAA"  No results found for: "WBC", "RBC", "HGB", "HCT", "PLT", "MCV", "MCH", "MCHC", "RDW", "LYMPHSABS", "MONOABS", "EOSABS", "BASOSABS"  No results found for: "POCLITH", "LITHIUM"   No results found for: "PHENYTOIN", "PHENOBARB", "VALPROATE", "CBMZ"   .res Assessment: Plan:    Plan:  D/C Prozac   Increase Lamictal 50mg  to 75mg  x 1 week, then increase to 100mg  daily   RTC 4 weeks  Counseled patient regarding potential benefits, risks, and side effects of Lamictal to include potential risk of Stevens-Johnson syndrome. Advised patient to stop taking Lamictal and contact office immediately if rash develops and to seek urgent medical attention if rash is severe and/or spreading quickly.    Patient  advised to contact office with any questions, adverse effects, or acute worsening in signs and symptoms.  Time spent with patient was 15 minutes. Greater than 50% of face to face time with patient was spent on counseling and coordination of care.   Diagnoses and all orders for this visit:  Episodic mood disorder (HCC) -     lamoTRIgine (LAMICTAL) 100 MG tablet; Take one tablet at bedtime.  Generalized anxiety disorder     Please see After Visit Summary for patient specific instructions.  Future Appointments  Date Time Provider Department Center  05/23/2022  4:20 PM Hubert Derstine, , NP [facility] None    No orders of the defined types were placed in this encounter.     -------------------------------

## 2022-03-16 ENCOUNTER — Other Ambulatory Visit: Payer: Self-pay

## 2022-03-16 ENCOUNTER — Other Ambulatory Visit: Payer: Self-pay | Admitting: Adult Health

## 2022-03-16 DIAGNOSIS — F39 Unspecified mood [affective] disorder: Secondary | ICD-10-CM

## 2022-03-16 MED ORDER — LAMOTRIGINE 100 MG PO TABS: ORAL_TABLET | ORAL | 2 refills | Status: AC

## 2022-05-23 ENCOUNTER — Ambulatory Visit (INDEPENDENT_AMBULATORY_CARE_PROVIDER_SITE_OTHER): Payer: Self-pay | Admitting: Adult Health

## 2022-05-23 DIAGNOSIS — Z0389 Encounter for observation for other suspected diseases and conditions ruled out: Secondary | ICD-10-CM

## 2022-05-23 NOTE — Progress Notes (Signed)
Patient no show appointment. ? ?

## 2022-06-15 ENCOUNTER — Other Ambulatory Visit: Payer: Self-pay | Admitting: Adult Health

## 2022-06-15 DIAGNOSIS — F39 Unspecified mood [affective] disorder: Secondary | ICD-10-CM

## 2024-02-01 ENCOUNTER — Ambulatory Visit (INDEPENDENT_AMBULATORY_CARE_PROVIDER_SITE_OTHER)

## 2024-02-01 ENCOUNTER — Encounter: Payer: Self-pay | Admitting: Podiatry

## 2024-02-01 ENCOUNTER — Ambulatory Visit: Admitting: Podiatry

## 2024-02-01 DIAGNOSIS — M722 Plantar fascial fibromatosis: Secondary | ICD-10-CM

## 2024-02-01 MED ORDER — DICLOFENAC SODIUM 75 MG PO TBEC: 75.0000 mg | DELAYED_RELEASE_TABLET | Freq: Two times a day (BID) | ORAL | 2 refills | Status: AC

## 2024-02-01 MED ORDER — TRIAMCINOLONE ACETONIDE 10 MG/ML IJ SUSP
10.0000 mg | Freq: Once | INTRAMUSCULAR | Status: AC
  Administered 2024-02-01: 10 mg via INTRA_ARTICULAR

## 2024-02-01 NOTE — Patient Instructions (Signed)

## 2024-02-05 NOTE — Progress Notes (Signed)
 Subjective:   Patient ID: Sydney Peters, female   DOB: 29 y.o.   MRN: [id number]   HPI Patient presents with exquisite discomfort in the left heel stating it is been around for around 6 months and does have family history.  Patient likes to be active and do different types of sports and does not smoke   Review of Systems  All other systems reviewed and are negative.       Objective:  Physical Exam Vitals and nursing note reviewed.  Constitutional:      Appearance: She is well-developed.  Pulmonary:     Effort: Pulmonary effort is normal.  Musculoskeletal:        General: Normal range of motion.  Skin:    General: Skin is warm.  Neurological:     Mental Status: She is alert.     Neurovascular status intact muscle strength found to be adequate range of motion adequate with exquisite discomfort in the plantar aspect of the left heel at the insertional point of the tendon calcaneus with inflammation fluid around the medial band.  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  I applied fascial brace properly fitted into the arch and I want patient to wear good support shoes and discussed orthotics for the long-term.  Placed on diclofenac  75 mg twice daily reappoint to recheck  X-rays indicate spur formation no indication of stress fracture or arthritis

## 2024-02-22 ENCOUNTER — Ambulatory Visit: Admitting: Podiatry

## 2024-02-22 DIAGNOSIS — M722 Plantar fascial fibromatosis: Secondary | ICD-10-CM

## 2024-02-23 NOTE — Progress Notes (Signed)
 Subjective:   Patient ID: Sydney Peters, female   DOB: 29 y.o.   MRN: [id number]   HPI Patient states she is improved quite a bit and having minimal discomfort around the heel at this time.  Wearing brace and trying to wear good support shoes   ROS      Objective:  Physical Exam  Neurovascular status is found to be intact muscle strength is found to be adequate range of motion within normal limits with patient found to have significant reduction of inflammation plantar aspect left heel.  Does have moderate depression of the arch which is contributory factor to condition mild HAV deformity     Assessment:  Structural condition but I do think there may have been a significant acute manifestation of this     Plan:  H&P reviewed plantar fasciitis discussed treatment options with inclusion and continuing of good support shoes stretching exercises and the possibility of orthotics if symptoms come back or issues were to occur.  I discussed all this with her and encouraged her to let us  know if any issues were to occur and we will continue treatment but at this point I am optimistic
